# Patient Record
Sex: Female | Born: 1955 | Race: White | Hispanic: No | Smoking: Former smoker
Health system: Southern US, Community
[De-identification: ages and names within clinical notes are randomized; demographics above are authoritative.]

## PROBLEM LIST (undated history)

## (undated) DIAGNOSIS — Z9889 Other specified postprocedural states: Secondary | ICD-10-CM

## (undated) DIAGNOSIS — T7840XA Allergy, unspecified, initial encounter: Secondary | ICD-10-CM

## (undated) DIAGNOSIS — F419 Anxiety disorder, unspecified: Secondary | ICD-10-CM

## (undated) DIAGNOSIS — G250 Essential tremor: Secondary | ICD-10-CM

## (undated) DIAGNOSIS — H269 Unspecified cataract: Secondary | ICD-10-CM

## (undated) DIAGNOSIS — C801 Malignant (primary) neoplasm, unspecified: Secondary | ICD-10-CM

## (undated) HISTORY — DX: Essential tremor: G25.0

## (undated) HISTORY — DX: Malignant (primary) neoplasm, unspecified: C80.1

## (undated) HISTORY — DX: Anxiety disorder, unspecified: F41.9

## (undated) HISTORY — PX: APPENDECTOMY: SHX54

## (undated) HISTORY — DX: Unspecified cataract: H26.9

## (undated) HISTORY — DX: Allergy, unspecified, initial encounter: T78.40XA

## (undated) HISTORY — PX: TUBAL LIGATION: SHX77

## (undated) HISTORY — PX: TONSILLECTOMY: SUR1361

## (undated) HISTORY — PX: BREAST CYST ASPIRATION: SHX578

---

## 1999-07-23 DIAGNOSIS — C801 Malignant (primary) neoplasm, unspecified: Secondary | ICD-10-CM

## 1999-07-23 HISTORY — DX: Malignant (primary) neoplasm, unspecified: C80.1

## 2012-11-18 DIAGNOSIS — G250 Essential tremor: Secondary | ICD-10-CM | POA: Insufficient documentation

## 2013-11-29 ENCOUNTER — Ambulatory Visit: Payer: Self-pay | Admitting: Physician Assistant

## 2013-11-29 IMAGING — US US EXTREM LOW VENOUS*L*
1 series · 13 of 24 positions shown · non-contrast
Comparison: None.

CLINICAL DATA: Left lower extremity pain and swelling evaluate for
DVT



[Series 1: us extrem low venous*left* · 0.09mm/px · 13 of 42 slices shown]
[im 1/42]
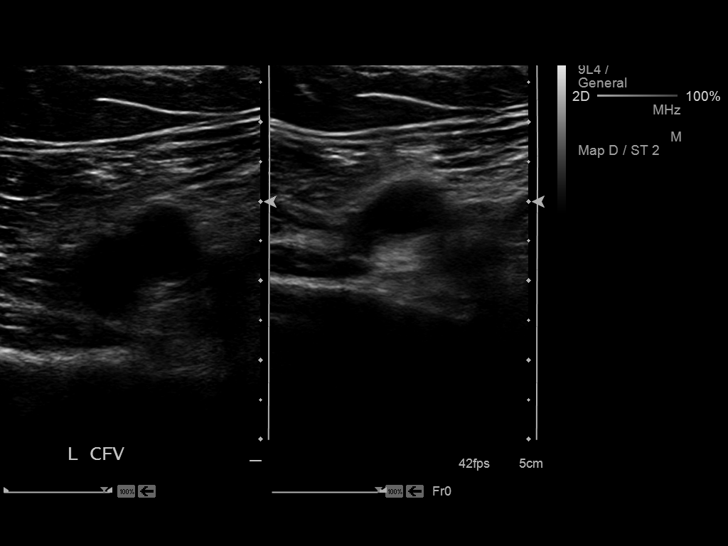
[im 4/42]
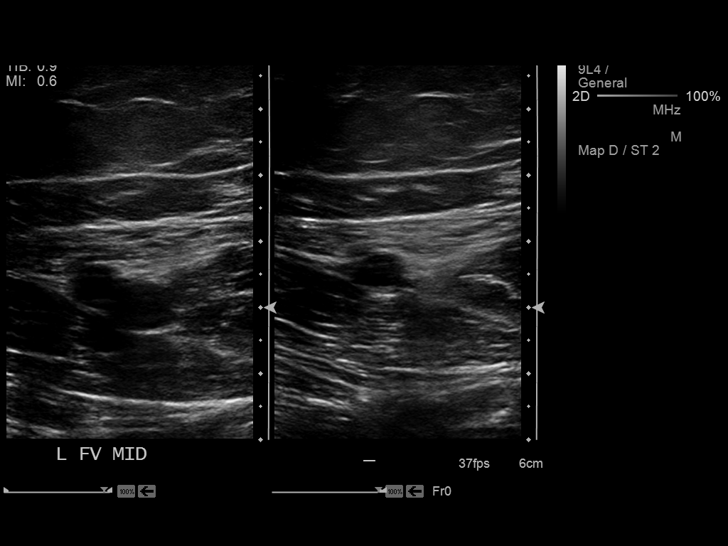
[im 8/42]
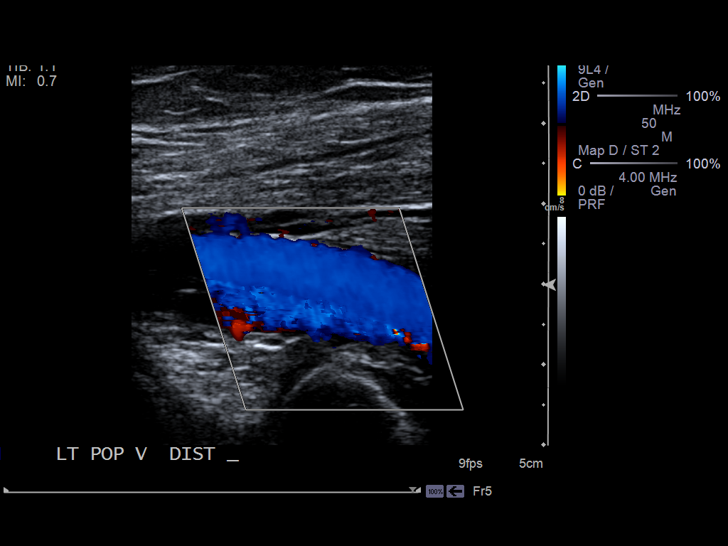
[im 11/42]
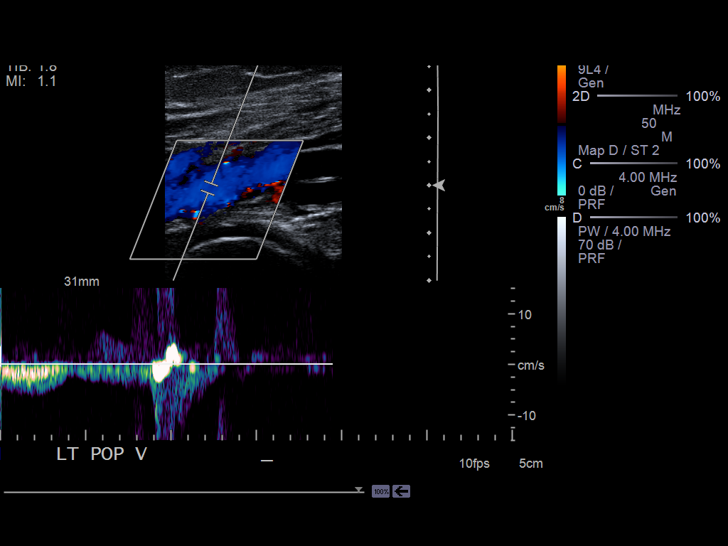
[im 15/42]
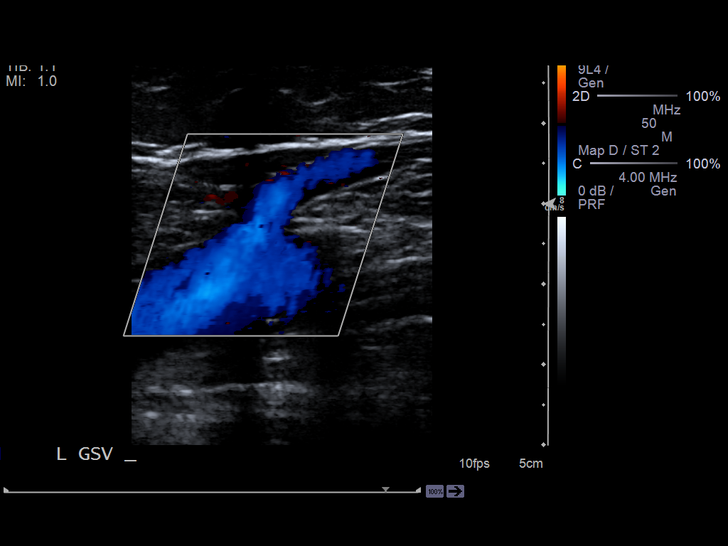
[im 18/42]
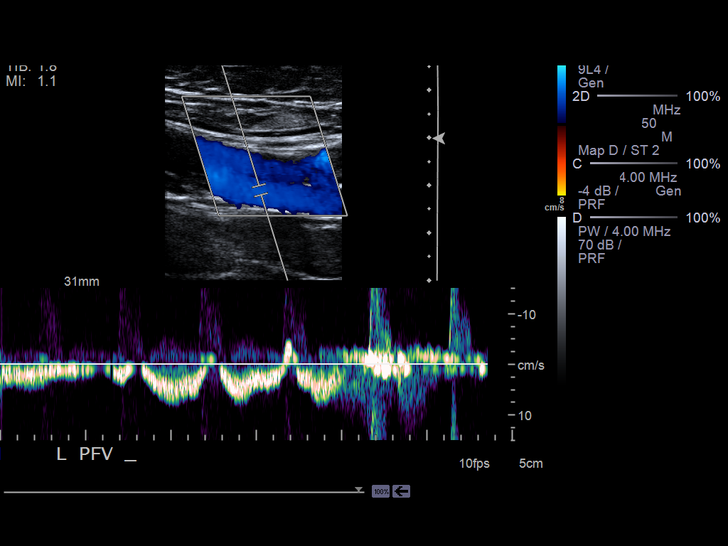
[im 22/42]
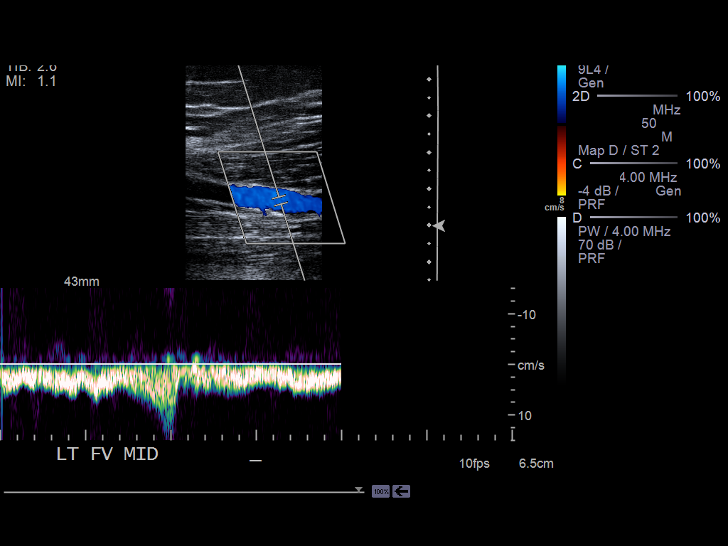
[im 24/42]
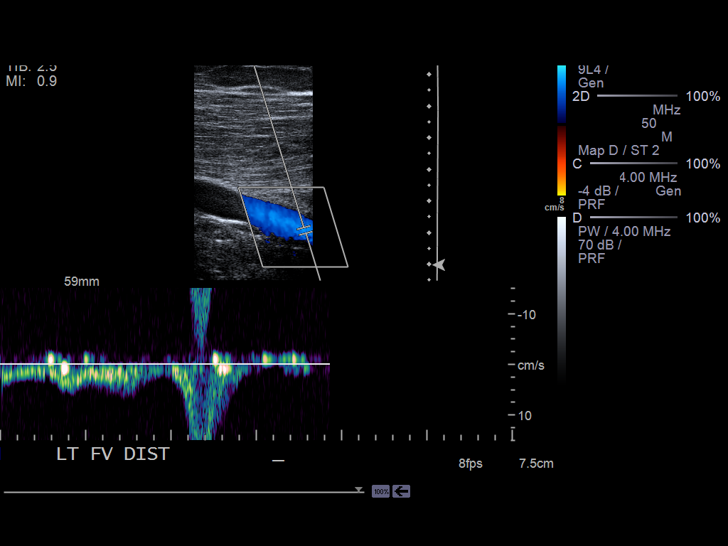
[im 27/42]
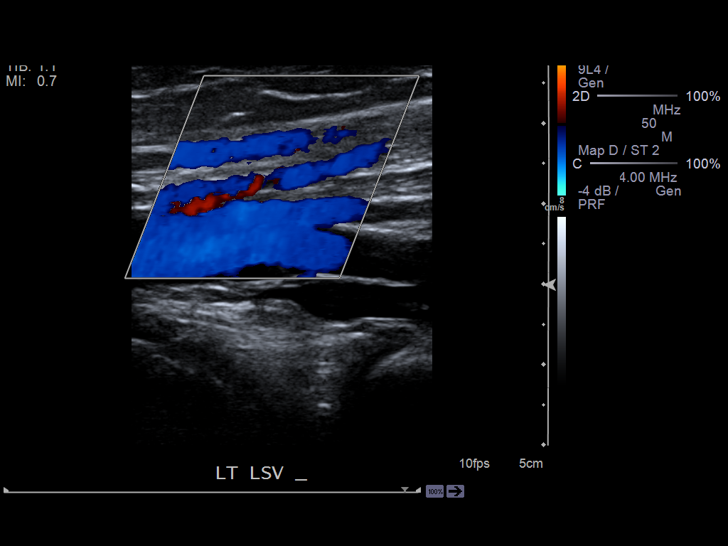
[im 31/42]
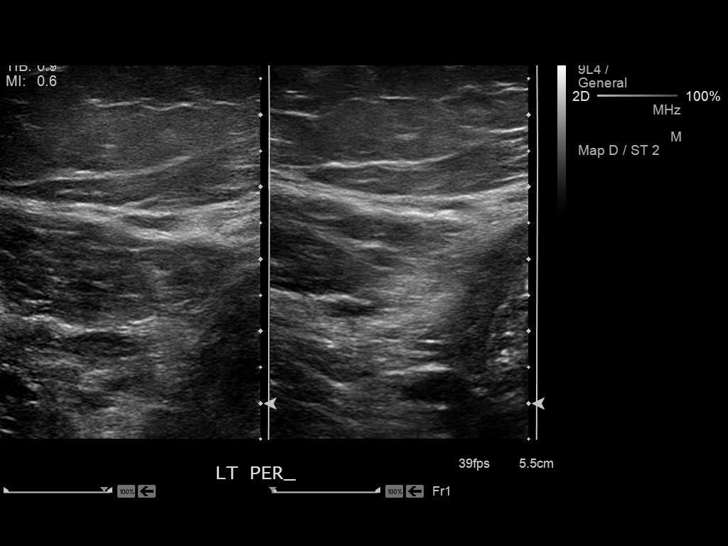
[im 34/42]
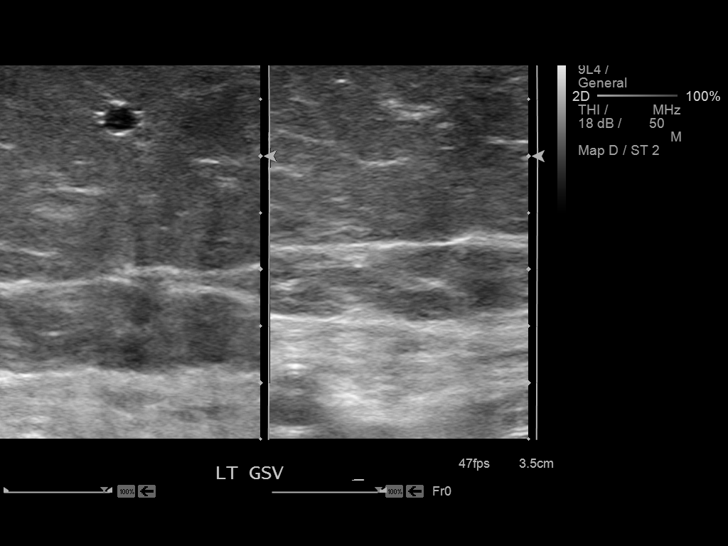
[im 38/42]
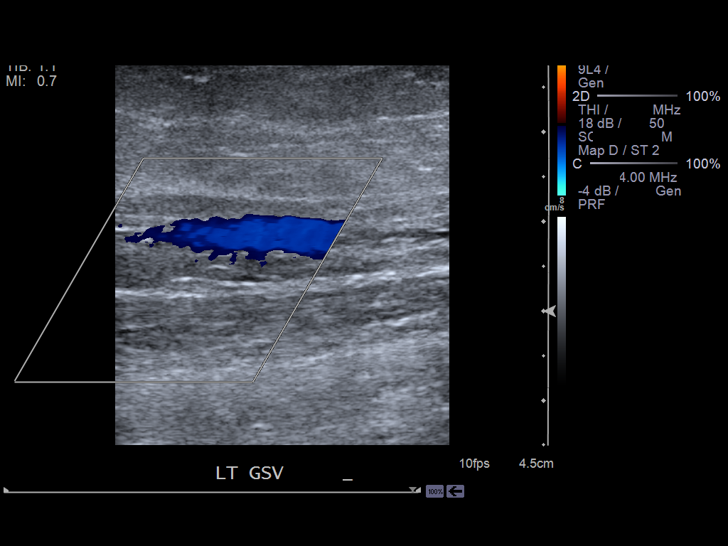
[im 42/42]
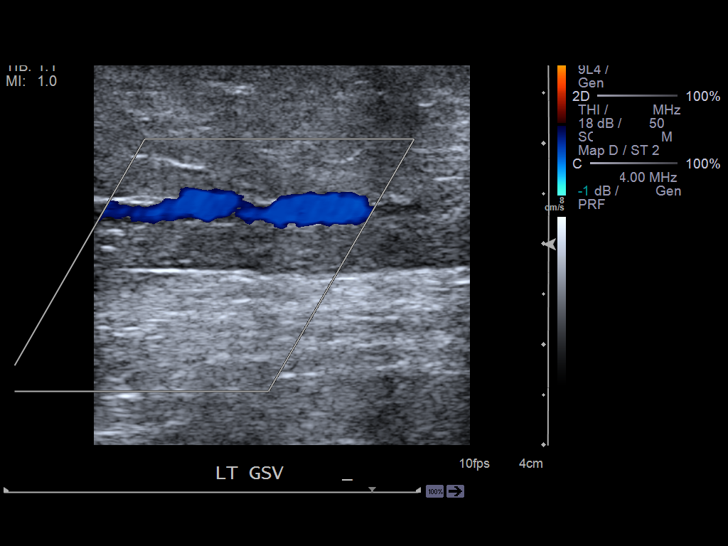

[13 of 24 positions shown; findings below may reference images not displayed]

FINDINGS: Common Femoral Vein: No evidence of thrombus. Normal
compressibility, respiratory phasicity and response to augmentation.

Saphenofemoral Junction: No evidence of thrombus. Normal
compressibility and flow on color Doppler imaging.

Profunda Femoral Vein: No evidence of thrombus. Normal
compressibility and flow on color Doppler imaging.

Femoral Vein: No evidence of thrombus. Normal compressibility,
respiratory phasicity and response to augmentation.

Popliteal Vein: No evidence of thrombus. Normal compressibility,
respiratory phasicity and response to augmentation.

Calf Veins: No evidence of thrombus. Normal compressibility and flow
on color Doppler imaging.

Superficial Great Saphenous Vein: No evidence of thrombus. Normal
compressibility and flow on color Doppler imaging.

Venous Reflux:  None.

Other Findings:  None.
IMPRESSION: No evidence of deep venous thrombosis.

## 2014-04-18 DIAGNOSIS — N6019 Diffuse cystic mastopathy of unspecified breast: Secondary | ICD-10-CM | POA: Insufficient documentation

## 2015-02-03 ENCOUNTER — Encounter: Payer: Self-pay | Admitting: Family Medicine

## 2015-02-03 ENCOUNTER — Ambulatory Visit (INDEPENDENT_AMBULATORY_CARE_PROVIDER_SITE_OTHER): Payer: 59 | Admitting: Family Medicine

## 2015-02-03 ENCOUNTER — Encounter (INDEPENDENT_AMBULATORY_CARE_PROVIDER_SITE_OTHER): Payer: Self-pay

## 2015-02-03 VITALS — BP 124/70 | HR 100 | Temp 98.0°F | Resp 16 | Ht 65.5 in | Wt 167.3 lb

## 2015-02-03 DIAGNOSIS — G250 Essential tremor: Secondary | ICD-10-CM

## 2015-02-03 DIAGNOSIS — F064 Anxiety disorder due to known physiological condition: Secondary | ICD-10-CM | POA: Diagnosis not present

## 2015-02-03 DIAGNOSIS — R208 Other disturbances of skin sensation: Secondary | ICD-10-CM

## 2015-02-03 DIAGNOSIS — R209 Unspecified disturbances of skin sensation: Secondary | ICD-10-CM

## 2015-02-03 DIAGNOSIS — Z77120 Contact with and (suspected) exposure to mold (toxic): Secondary | ICD-10-CM | POA: Insufficient documentation

## 2015-02-03 DIAGNOSIS — Q120 Congenital cataract: Secondary | ICD-10-CM | POA: Insufficient documentation

## 2015-02-03 DIAGNOSIS — Z13220 Encounter for screening for lipoid disorders: Secondary | ICD-10-CM

## 2015-02-03 MED ORDER — ALPRAZOLAM 0.25 MG PO TABS
0.2500 mg | ORAL_TABLET | Freq: Every evening | ORAL | Status: DC | PRN
Start: 2015-02-03 — End: 2021-08-27

## 2015-02-03 NOTE — Progress Notes (Signed)
Name: Jacqueline Hodge   MRN: [id number]    DOB: 06/26/1956   Date:02/03/2015       Progress Note  Subjective  Chief Complaint  Chief Complaint  Patient presents with  . Establish Care    patient question a noctural infestation in home that is affecting her eyes.  . Labs Only    patient has a head tremor and has just stopped the medication for it.    HPI  [name] is a pleasant 59yo female here to establish care today. She has a significant past medical history of resting head tremor. She has seen Neurology specialty in the past and more recently has tried propranolol with minimal improvement in her symptoms. She denies any other related issues such as focal neurological deficits, memory changes, gait or balance disturbance. No suggestion of Parkinson's or other neurological disorders. In relation to her head tremor the patient reports have anxiety and has previously used low dose alprazolam at bed time to help with her symptoms. Recently she has had sensations of bugs crawling on her eyes and in her nostrils at night only. She is not sure if this is an infestation of some sort or mold spores. Her ophthalmologist has not found anything to explain her symptoms. She has been using benadryl at night.  Patient Active Problem List   Diagnosis Date Noted  . Anxiety disorder due to general medical condition 02/03/2015  . 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 Sensation disorder 02/03/2015  . Screening cholesterol level 02/03/2015  . Cystic disease of breast 04/18/2014  . Benign head tremor 11/18/2012    History  Substance Use Topics  . Smoking status: Former Research scientist (life sciences)  . Smokeless tobacco: Not on file     Comment: patient is using the nicotine fum  . Alcohol Use: 0.0 oz/week    0 Standard drinks or equivalent per week     Current outpatient prescriptions:  .  ALPRAZolam (XANAX) 0.25 MG tablet, Take 1 tablet (0.25 mg total) by mouth at bedtime as needed for anxiety., Disp: 30 tablet, Rfl:  3  Past Surgical History  Procedure Laterality Date  . Tonsillectomy    . Appendectomy    . Breast cyst aspiration    . Tubal ligation      Family History  Problem Relation Age of Onset  . Osteoporosis Mother   . Hearing loss Mother   . Hypertension Mother   . Stroke Mother   . Macular degeneration Mother   . Hearing loss Father   . Hyperlipidemia Father   . Heart disease Paternal Grandmother     No Known Allergies   Review of Systems  CONSTITUTIONAL: No significant weight changes, fever, chills, weakness or fatigue.  HEENT:  - Eyes: No visual changes.  - Ears: No auditory changes. No pain.  - Nose: No sneezing, congestion, runny nose. - Throat: No sore throat. No changes in swallowing. SKIN: No rash or itching.  CARDIOVASCULAR: No chest pain, chest pressure or chest discomfort. No palpitations or edema.  RESPIRATORY: No shortness of breath, cough or sputum.  GASTROINTESTINAL: No anorexia, nausea, vomiting. No changes in bowel habits. No abdominal pain or blood.  GENITOURINARY: No dysuria. No frequency. No discharge.  NEUROLOGICAL: No headache, dizziness, syncope, paralysis, ataxia, numbness or tingling in the extremities. No memory changes. No change in bowel or bladder control. Baseline head and neck tremor. MUSCULOSKELETAL: No joint pain. No muscle pain. HEMATOLOGIC: No anemia, bleeding or bruising.  LYMPHATICS: No enlarged lymph nodes.  PSYCHIATRIC:  Yes anxiety. No change in sleep pattern.  ENDOCRINOLOGIC: No reports of sweating, cold or heat intolerance. No polyuria or polydipsia.      Objective  BP 124/70 mmHg  Pulse 100  Temp(Src) 98 F (36.7 C) (Oral)  Resp 16  Ht 5' 5.5" (1.664 m)  Wt 167 lb 4.8 oz (75.887 kg)  BMI 27.41 kg/m2  SpO2 96% Body mass index is 27.41 kg/(m^2).  Physical Exam  Constitutional: Patient appears well-developed and well-nourished. In no distress.  HEENT:  - Head: Normocephalic and atraumatic.  - Ears: Bilateral TMs gray,  no erythema or effusion - Nose: Nasal mucosa moist - Mouth/Throat: Oropharynx is clear and moist. No tonsillar hypertrophy or erythema. No post nasal drainage.  - Eyes: Conjunctivae clear, EOM movements normal. PERRLA. No scleral icterus.  Neck: Normal range of motion. Neck supple. No JVD present. No thyromegaly present.  Cardiovascular: Normal rate, regular rhythm and normal heart sounds.  No murmur heard.  Pulmonary/Chest: Effort normal and breath sounds normal. No respiratory distress. Musculoskeletal: Normal range of motion bilateral UE and LE, no joint effusions. Peripheral vascular: Bilateral LE no edema. Neurological: CN II-XII grossly intact with no focal deficits. Resting head tremor worse with attempt to sit still. Alert and oriented to person, place, and time. Coordination, balance, strength, speech and gait are normal.  Skin: Skin is warm and dry. No rash noted. No erythema.  Psychiatric: Patient has a normal mood and affect. Behavior is normal in office today. Judgment and thought content normal in office today.   Assessment & Plan  1. Benign head tremor Will start with blood work and consider alternative therapies for benign tremor. If symptoms persist or worsen may benefit from further Neurology consultation.  - CBC with Differential/Platelet - Comprehensive metabolic panel - Lipid panel - TSH  2. Anxiety disorder due to general medical condition Anxiety related to head tremor. Trial of alprazolam at bedtime. Discussed potential for habit forming properties. SSRI's have made tremor worse in the past.   - ALPRAZolam (XANAX) 0.25 MG tablet; Take 1 tablet (0.25 mg total) by mouth at bedtime as needed for anxiety.  Dispense: 30 tablet; Refill: 3  3. Congenital cataract Continue follow up with eye specialist.   4. Sensation disorder Description of crawling sensation over eyes and in nostrils unclear as to etiology. May be nerve irritation related vs true infestation. Will  start with blood work.  - CBC with Differential/Platelet - Comprehensive metabolic panel - Lipid panel - TSH  5. Screening cholesterol level  - Lipid panel

## 2015-02-04 LAB — COMPREHENSIVE METABOLIC PANEL
A/G RATIO: 1.9 (ref 1.1–2.5)
ALK PHOS: 66 IU/L (ref 39–117)
ALT: 18 IU/L (ref 0–32)
AST: 21 IU/L (ref 0–40)
Albumin: 4.7 g/dL (ref 3.5–5.5)
BUN / CREAT RATIO: 15 (ref 9–23)
BUN: 14 mg/dL (ref 6–24)
Bilirubin Total: 0.6 mg/dL (ref 0.0–1.2)
CO2: 23 mmol/L (ref 18–29)
Calcium: 10.5 mg/dL — ABNORMAL HIGH (ref 8.7–10.2)
Chloride: 104 mmol/L (ref 97–108)
Creatinine, Ser: 0.92 mg/dL (ref 0.57–1.00)
GFR calc Af Amer: 79 mL/min/{1.73_m2} (ref >59)
GFR calc non Af Amer: 69 mL/min/{1.73_m2} (ref >59)
GLOBULIN, TOTAL: 2.5 g/dL (ref 1.5–4.5)
GLUCOSE: 106 mg/dL — AB (ref 65–99)
Potassium: 5.3 mmol/L — ABNORMAL HIGH (ref 3.5–5.2)
SODIUM: 149 mmol/L — AB (ref 134–144)
Total Protein: 7.2 g/dL (ref 6.0–8.5)

## 2015-02-04 LAB — CBC WITH DIFFERENTIAL/PLATELET
Basophils Absolute: 0.1 10*3/uL (ref 0.0–0.2)
Basos: 1 %
EOS (ABSOLUTE): 0.2 10*3/uL (ref 0.0–0.4)
EOS: 4 %
HEMATOCRIT: 42.3 % (ref 34.0–46.6)
Hemoglobin: 14.5 g/dL (ref 11.1–15.9)
IMMATURE GRANS (ABS): 0 10*3/uL (ref 0.0–0.1)
Immature Granulocytes: 0 %
LYMPHS ABS: 1.6 10*3/uL (ref 0.7–3.1)
Lymphs: 28 %
MCH: 30.5 pg (ref 26.6–33.0)
MCHC: 34.3 g/dL (ref 31.5–35.7)
MCV: 89 fL (ref 79–97)
MONOS ABS: 0.5 10*3/uL (ref 0.1–0.9)
Monocytes: 10 %
Neutrophils Absolute: 3.3 10*3/uL (ref 1.4–7.0)
Neutrophils: 57 %
Platelets: 296 10*3/uL (ref 150–379)
RBC: 4.76 x10E6/uL (ref 3.77–5.28)
RDW: 14.3 % (ref 12.3–15.4)
WBC: 5.7 10*3/uL (ref 3.4–10.8)

## 2015-02-04 LAB — LIPID PANEL
CHOL/HDL RATIO: 2.9 ratio (ref 0.0–4.4)
Cholesterol, Total: 210 mg/dL — ABNORMAL HIGH (ref 100–199)
HDL: 72 mg/dL (ref >39)
LDL Calculated: 111 mg/dL — ABNORMAL HIGH (ref 0–99)
Triglycerides: 133 mg/dL (ref 0–149)
VLDL Cholesterol Cal: 27 mg/dL (ref 5–40)

## 2015-02-04 LAB — TSH: TSH: 1.46 u[IU]/mL (ref 0.450–4.500)

## 2015-02-06 NOTE — Progress Notes (Signed)
No other recommendations at this time. F/u 1 month for repeat lab testing.

## 2015-02-16 ENCOUNTER — Encounter: Payer: Self-pay | Admitting: Family Medicine

## 2015-02-20 ENCOUNTER — Telehealth: Payer: Self-pay | Admitting: Family Medicine

## 2015-02-20 NOTE — Telephone Encounter (Signed)
Patient has an appt on 03/03/15 at 8am

## 2015-02-20 NOTE — Telephone Encounter (Signed)
Contacted this patient to review the message from below. Patient was strongly advised to go to the nearest ER if she develops sx of SOB or tightness in her chest, otherwise she can cont taking the antihistamine as she stated she was, wipe down what she can with Clorox,  and remove herself from that environment as soon as possible. Patient said ok.

## 2015-02-20 NOTE — Telephone Encounter (Signed)
Patient found mold in her home. She will be moving out within 3weeks. Her nasal is stuffy, trouble breathing, spores on her skin. Requesting medication to help until she moves out. Please sent to [email or web address]. [phone]

## 2015-02-20 NOTE — Telephone Encounter (Signed)
Unfortunately there are no medications from a primary medicine standpoint to cure or alleviate household mold exposure. She may try taking an allergy medication found OTC such as allegra, claritin or zyrtec daily but other than that the only other possible avenue is seeing an allergist or pulmonologist if her symptoms or vitals during the next visit suggest she needs to see them. Moving out and way from her home will be the ultimate solution which she is already working on.

## 2015-03-03 ENCOUNTER — Ambulatory Visit (INDEPENDENT_AMBULATORY_CARE_PROVIDER_SITE_OTHER): Payer: 59 | Admitting: Family Medicine

## 2015-03-03 ENCOUNTER — Encounter: Payer: Self-pay | Admitting: Family Medicine

## 2015-03-03 VITALS — BP 118/78 | HR 88 | Temp 97.6°F | Resp 16 | Ht 65.5 in | Wt 169.5 lb

## 2015-03-03 DIAGNOSIS — Z77120 Contact with and (suspected) exposure to mold (toxic): Secondary | ICD-10-CM

## 2015-03-03 DIAGNOSIS — J45909 Unspecified asthma, uncomplicated: Secondary | ICD-10-CM

## 2015-03-03 DIAGNOSIS — G250 Essential tremor: Secondary | ICD-10-CM

## 2015-03-03 DIAGNOSIS — R0982 Postnasal drip: Secondary | ICD-10-CM

## 2015-03-03 DIAGNOSIS — J309 Allergic rhinitis, unspecified: Secondary | ICD-10-CM

## 2015-03-03 MED ORDER — FLUTICASONE PROPIONATE 50 MCG/ACT NA SUSP: 2.0000 | Freq: Every day | NASAL | Status: DC

## 2015-03-03 NOTE — Progress Notes (Signed)
Name: Jacqueline Hodge   MRN: [id number]    DOB: 02/07/1956   Date:03/03/2015       Progress Note  Subjective  Chief Complaint  Chief Complaint  Patient presents with  . Follow-up    patient  is also is here to talk about the mold in her apartment.  . Tremors    patient wants to talk about a new medication.    HPI  Jacqueline Hodge is a pleasant 59yo female with a significant past medical history of resting head tremor. She has seen Neurology specialty in the past and more recently has tried propranolol with minimal improvement in her symptoms. She denies any other related issues such as focal neurological deficits, memory changes, gait or balance disturbance. No suggestion of Parkinson's or other neurological disorders. In relation to her head tremor the patient reports have anxiety and has previously used low dose alprazolam at bed time to help with her symptoms which was restarted at our last visit and she reports that it is helping. In the past she has used propranolol, topiramate, primidone, mirtazapine with minimal relief of head tremor and intolerable side effects. Her grandfather had tremors of his hands.   Recently she has had sensations of bugs crawling on her eyes and in her nostrils at night only. She has found mold in her home and is convinced her symptoms are related to this.  She is moving out in 1 week and hopes to have resolution of the sensations she feels at night. Otherwise she denies shortness of breath, cough, headaches, skin rashes. She is having some sinus congestion however. Her ophthalmologist has not found anything to explain her symptoms. She has been using benadryl at night as needed.   Patient Active Problem List   Diagnosis Date Noted  . Anxiety disorder due to general medical condition 02/03/2015  . 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 Sensation disorder 02/03/2015  . Screening cholesterol level 02/03/2015  . Cystic disease of breast 04/18/2014  . Benign head  tremor 11/18/2012    Social History  Substance Use Topics  . Smoking status: Former Research scientist (life sciences)  . Smokeless tobacco: Not on file     Comment: patient is using the nicotine fum  . Alcohol Use: 0.0 oz/week    0 Standard drinks or equivalent per week     Current outpatient prescriptions:  .  ALPRAZolam (XANAX) 0.25 MG tablet, Take 1 tablet (0.25 mg total) by mouth at bedtime as needed for anxiety., Disp: 30 tablet, Rfl: 3  Past Surgical History  Procedure Laterality Date  . Tonsillectomy    . Appendectomy    . Breast cyst aspiration    . Tubal ligation      Family History  Problem Relation Age of Onset  . Osteoporosis Mother   . Hearing loss Mother   . Hypertension Mother   . Stroke Mother   . Macular degeneration Mother   . Hearing loss Father   . Hyperlipidemia Father   . Heart disease Paternal Grandmother     No Known Allergies   Review of Systems  CONSTITUTIONAL: No significant weight changes, fever, chills, weakness or fatigue.  HEENT:  - Eyes: No visual changes.  - Ears: No auditory changes. No pain.  - Nose: No sneezing, runny nose.  Yes congestion. - Throat: No sore throat. No changes in swallowing. SKIN: No rash or itching.  CARDIOVASCULAR: No chest pain, chest pressure or chest discomfort. No palpitations or edema.  RESPIRATORY: No shortness of breath, cough  or sputum.  GASTROINTESTINAL: No anorexia, nausea, vomiting. No changes in bowel habits. No abdominal pain or blood.  GENITOURINARY: No dysuria. No frequency. No discharge.  NEUROLOGICAL: No headache, dizziness, syncope, paralysis, ataxia, numbness or tingling in the extremities. No memory changes. No change in bowel or bladder control. Baseline head and neck tremor. MUSCULOSKELETAL: No joint pain. No muscle pain. HEMATOLOGIC: No anemia, bleeding or bruising.  LYMPHATICS: No enlarged lymph nodes.  PSYCHIATRIC: Yes anxiety. No change in sleep pattern.  ENDOCRINOLOGIC: No reports of sweating,  cold or heat intolerance. No polyuria or polydipsia.      Objective  BP 118/78 mmHg  Pulse 88  Temp(Src) 97.6 F (36.4 C) (Oral)  Resp 16  Ht 5' 5.5" (1.664 m)  Wt 169 lb 8 oz (76.885 kg)  BMI 27.77 kg/m2  SpO2 97% Body mass index is 27.77 kg/(m^2).  Physical Exam  Constitutional: Patient appears well-developed and well-nourished. In no distress.  HEENT:  - Head: Normocephalic and atraumatic.  - Ears: Bilateral TMs gray, no erythema or effusion - Nose: Nasal mucosa moist, boggy and congested. - Mouth/Throat: Oropharynx is clear and moist. No tonsillar hypertrophy or erythema. Yes post nasal drainage.  - Eyes: Conjunctivae clear, EOM movements normal. PERRLA. No scleral icterus.  Neck: Normal range of motion. Neck supple. No JVD present. No thyromegaly present.  Cardiovascular: Normal rate, regular rhythm and normal heart sounds.  No murmur heard.  Pulmonary/Chest: Effort normal and breath sounds normal. No respiratory distress. Musculoskeletal: Normal range of motion bilateral UE and LE, no joint effusions. Peripheral vascular: Bilateral LE no edema. Neurological: CN II-XII grossly intact with no focal deficits. Resting head tremor worse with attempt to sit still. Alert and oriented to person, place, and time. Coordination, balance, strength, speech and gait are normal.  Skin: Skin is warm and dry. No rash noted. No erythema.  Psychiatric: Patient has a normal mood and affect. Behavior is normal in office today. Judgment and thought content normal in office today.   Recent Results (from the past 2160 hour(s))  CBC with Differential/Platelet     Status: None   Collection Time: 02/03/15 10:23 AM  Result Value Ref Range   WBC 5.7 3.4 - 10.8 x10E3/uL   RBC 4.76 3.77 - 5.28 x10E6/uL   Hemoglobin 14.5 11.1 - 15.9 g/dL   Hematocrit 42.3 34.0 - 46.6 %   MCV 89 79 - 97 fL   MCH 30.5 26.6 - 33.0 pg   MCHC 34.3 31.5 - 35.7 g/dL   RDW 14.3 12.3 - 15.4 %   Platelets 296 150 - 379  x10E3/uL   Neutrophils 57 %   Lymphs 28 %   Monocytes 10 %   Eos 4 %   Basos 1 %   Neutrophils Absolute 3.3 1.4 - 7.0 x10E3/uL   Lymphocytes Absolute 1.6 0.7 - 3.1 x10E3/uL   Monocytes Absolute 0.5 0.1 - 0.9 x10E3/uL   EOS (ABSOLUTE) 0.2 0.0 - 0.4 x10E3/uL   Basophils Absolute 0.1 0.0 - 0.2 x10E3/uL   Immature Granulocytes 0 %   Immature Grans (Abs) 0.0 0.0 - 0.1 x10E3/uL  Comprehensive metabolic panel     Status: Abnormal   Collection Time: 02/03/15 10:23 AM  Result Value Ref Range   Glucose 106 (H) 65 - 99 mg/dL    Comment: Specimen received in contact with cells. No visible hemolysis present. However GLUC may be decreased and K increased. Clinical correlation indicated.    BUN 14 6 - 24 mg/dL   Creatinine, Ser  0.92 0.57 - 1.00 mg/dL   GFR calc non Af Amer 69 >59 mL/min/1.73   GFR calc Af Amer 79 >59 mL/min/1.73   BUN/Creatinine Ratio 15 9 - 23   Sodium 149 (H) 134 - 144 mmol/L   Potassium 5.3 (H) 3.5 - 5.2 mmol/L   Chloride 104 97 - 108 mmol/L   CO2 23 18 - 29 mmol/L   Calcium 10.5 (H) 8.7 - 10.2 mg/dL   Total Protein 7.2 6.0 - 8.5 g/dL   Albumin 4.7 3.5 - 5.5 g/dL   Globulin, Total 2.5 1.5 - 4.5 g/dL   Albumin/Globulin Ratio 1.9 1.1 - 2.5   Bilirubin Total 0.6 0.0 - 1.2 mg/dL   Alkaline Phosphatase 66 39 - 117 IU/L   AST 21 0 - 40 IU/L   ALT 18 0 - 32 IU/L  Lipid panel     Status: Abnormal   Collection Time: 02/03/15 10:23 AM  Result Value Ref Range   Cholesterol, Total 210 (H) 100 - 199 mg/dL   Triglycerides 133 0 - 149 mg/dL   HDL 72 >39 mg/dL    Comment: According to ATP-III Guidelines, HDL-C >59 mg/dL is considered a negative risk factor for CHD.    VLDL Cholesterol Cal 27 5 - 40 mg/dL   LDL Calculated 111 (H) 0 - 99 mg/dL   Chol/HDL Ratio 2.9 0.0 - 4.4 ratio units    Comment:                                   T. Chol/HDL Ratio                                             Men  Women                               1/2 Avg.Risk  3.4    3.3                                    Avg.Risk  5.0    4.4                                2X Avg.Risk  9.6    7.1                                3X Avg.Risk 23.4   11.0   TSH     Status: None   Collection Time: 02/03/15 10:23 AM  Result Value Ref Range   TSH 1.460 0.450 - 4.500 uIU/mL     Assessment & Plan  1. Benign head tremor Offered patient retrial of topiramate, primidone or mirtazapine but she has declined. She will continue to use alprazolam in the evenings.   2. Suspected exposure to mold No clinical consequences identified. Removal from exposure is recommended.  3. Allergic rhinitis with postnasal drip Trial of nasal steroid therapy.  - fluticasone (FLONASE) 50 MCG/ACT nasal spray; Place 2 sprays into both nostrils daily.  Dispense: 16 g; Refill: 2

## 2015-05-26 ENCOUNTER — Encounter: Payer: Self-pay | Admitting: Family Medicine

## 2021-08-27 ENCOUNTER — Encounter: Payer: Self-pay | Admitting: Urology

## 2021-08-27 ENCOUNTER — Ambulatory Visit: Payer: 59 | Admitting: Urology

## 2021-08-27 ENCOUNTER — Other Ambulatory Visit: Payer: Self-pay

## 2021-08-27 VITALS — BP 106/77 | HR 88 | Ht 68.0 in | Wt 200.0 lb

## 2021-08-27 DIAGNOSIS — R300 Dysuria: Secondary | ICD-10-CM | POA: Diagnosis not present

## 2021-08-27 DIAGNOSIS — R350 Frequency of micturition: Secondary | ICD-10-CM

## 2021-08-27 DIAGNOSIS — R3129 Other microscopic hematuria: Secondary | ICD-10-CM | POA: Diagnosis not present

## 2021-08-27 LAB — URINALYSIS, COMPLETE
Bilirubin, UA: NEGATIVE
Glucose, UA: NEGATIVE
Leukocytes,UA: NEGATIVE
Nitrite, UA: NEGATIVE
Specific Gravity, UA: 1.025 (ref 1.005–1.030)
Urobilinogen, Ur: 0.2 mg/dL (ref 0.2–1.0)
pH, UA: 6 (ref 5.0–7.5)

## 2021-08-27 LAB — MICROSCOPIC EXAMINATION

## 2021-08-27 MED ORDER — ESTRADIOL 0.1 MG/GM VA CREA: TOPICAL_CREAM | VAGINAL | 12 refills | Status: AC

## 2021-08-27 NOTE — Progress Notes (Signed)
08/27/2021 11:06 AM   Jacqueline Hodge [birth year] [id number]  Referring provider: Bobetta Lime, [place] [employer] [place],  [place] [address]  Chief Complaint  Patient presents with   Urinary Frequency    HPI: Jacqueline Hodge is a 66 y.o. female who presents for evaluation of recurrent UTIs.  States she had recurrent lower tract UTIs approximately 10 years ago which resolved after starting low-dose vaginal estrogen which she is no longer taking Relates to a 66-month history of recurrent UTIs with symptoms consisting of urinary frequency, urgency and dysuria Record review remarkable for 2 urgent care visits though urinalyses were not performed States she was seen at [facility] on 2/3 with symptoms of frequency, urgency and dysuria.  She was told UA was consistent with infection and a urine culture was ordered.  She was prescribed Macrobid however after taking 1 capsule she complained of severe headache and did not take any further antibiotics.  Her symptoms improved briefly with hydration however returned this morning Long history of microscopic hematuria and was evaluated [facility] in 2018 and had negative cystoscopy and upper tract imaging.   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 If you have any questions, ask your nurse or doctor.          STOP taking these medications    ALPRAZolam 0.25 MG tablet Commonly known as: Duanne Moron Stopped by: Abbie Sons, MD   fluticasone 50 MCG/ACT nasal  spray Commonly known as: FLONASE Stopped by: Abbie Sons, MD       TAKE these medications    Spiriva Respimat 1.25 MCG/ACT Aers Generic drug: Tiotropium Bromide Monohydrate INHALE 2 INHALATIONS (2.5 MCG TOTAL) BY MOUTH INTO THE LUNGS ONCE DAILY        Allergies: No Known Allergies  Family History: Family History  Problem Relation Age of Onset   Osteoporosis Mother    Hearing loss Mother    Hypertension Mother    Stroke Mother    Macular degeneration Mother    Hearing loss Father    Hyperlipidemia Father    Heart disease Paternal Grandmother     Social History:  reports that she has quit smoking. She does not have any smokeless tobacco history on file. She reports current alcohol use. She reports that she does not use drugs.   Physical Exam: BP 106/77    Pulse 88    Ht 5\' 8"  (1.727 m)    Wt 200 lb (90.7 kg)    BMI 30.41 kg/m   Constitutional:  Alert and oriented, No acute distress. HEENT: Advance AT, moist mucus membranes.  Trachea midline, no masses. Cardiovascular: No clubbing, cyanosis, or edema. Respiratory: Normal respiratory effort, no increased work of breathing. Neurologic: Grossly intact, no focal deficits, moving all 4 extremities. Psychiatric: Normal mood and affect.  Laboratory Data:  Urinalysis Appearance-yellow/cloudy  Dipstick-trace ketones/2+ blood/trace protein Microscopy-11-30 RBC/few bacteria   Assessment & Plan:   66 y.o. female with recurrent storage related voiding symptoms and dysuria consistent with UTI though no culture documentation Will contact [facility] and obtain culture result from last week We will await any antibiotic therapy until report reviewed Was given Gemtesa 75 mg samples for storage elated voiding symptoms Urinalysis today with microhematuria.  If urine culture negative would recommend CT urogram and cystoscopy She desires to restart Estrace and Rx sent to [place], [place] [address], [place], [place] [address] [phone]

## 2021-09-27 ENCOUNTER — Other Ambulatory Visit: Payer: 59 | Admitting: Urology

## 2022-01-01 ENCOUNTER — Ambulatory Visit: Payer: 59 | Admitting: Physician Assistant

## 2022-01-01 ENCOUNTER — Encounter: Payer: Self-pay | Admitting: Physician Assistant

## 2022-01-01 VITALS — BP 107/71 | HR 90 | Ht 68.0 in | Wt 200.0 lb

## 2022-01-01 DIAGNOSIS — R3915 Urgency of urination: Secondary | ICD-10-CM

## 2022-01-01 LAB — URINALYSIS, COMPLETE
Bilirubin, UA: NEGATIVE
Glucose, UA: NEGATIVE
Leukocytes,UA: NEGATIVE
Nitrite, UA: NEGATIVE
Specific Gravity, UA: 1.025 (ref 1.005–1.030)
Urobilinogen, Ur: 1 mg/dL (ref 0.2–1.0)
pH, UA: 7 (ref 5.0–7.5)

## 2022-01-01 LAB — MICROSCOPIC EXAMINATION: Epithelial Cells (non renal): >10 /hpf — AB (ref 0–10)

## 2022-01-01 NOTE — Progress Notes (Signed)
01/01/2022 1:35 PM   Jacqueline Hodge June 02, 1956 [id number]  CC: Chief Complaint  Patient presents with   Recurrent UTI   HPI: Jacqueline Hodge is a 66 y.o. female with PMH microscopic hematuria, possible recurrent UTIs who presents today for evaluation of possible UTI.   Today she reports severe diffuse back and lower abdominal pain last night associated with urinary urgency.  This interfered with her sleep.  She took probiotics, oregano oil, and another homeopathic remedy containing coconut oil for management of her symptoms and states while she is tired today due to lack of sleep, she feels improved this morning.  She is still having some bothersome urinary urgency.  She states she has some very mild urgency at baseline but does not wish to pursue pharmacotherapy at this time.  She denies history of nephrolithiasis.  In-office UA today positive for 1+ ketones, trace lysed blood, and trace protein; urine microscopy with >10 epithelial cells/hpf.  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 If you have any questions, ask your nurse or doctor.          estradiol 0.1 MG/GM vaginal cream Commonly known as: ESTRACE Apply a pea-sized amount to vaginal area by fingertip twice weekly   Spiriva Respimat 1.25 MCG/ACT Aers Generic drug: Tiotropium Bromide Monohydrate INHALE 2 INHALATIONS (2.5 MCG TOTAL) BY MOUTH INTO THE LUNGS ONCE DAILY        Allergies:  No Known Allergies  Family History: Family History  Problem  Relation Age of Onset   Osteoporosis Mother    Hearing loss Mother    Hypertension Mother    Stroke Mother    Macular degeneration Mother    Hearing loss Father    Hyperlipidemia Father    Heart disease Paternal Grandmother     Social History:   reports that she has quit smoking. She does not have any smokeless tobacco history on file. She reports current alcohol use. She reports that she does not use drugs.  Physical Exam: BP 107/71   Pulse 90   Ht '5\' 8"'$  (1.727 m)   Wt 200 lb (90.7 kg)   BMI 30.41 kg/m   Constitutional:  Alert and oriented, no acute distress, nontoxic appearing HEENT: [place], AT Cardiovascular: No clubbing, cyanosis, or edema Respiratory: Normal respiratory effort, no increased work of breathing Skin: No rashes, bruises or suspicious lesions Neurologic: Grossly intact, no focal deficits, moving all 4 extremities Psychiatric: Normal mood and affect  Laboratory Data: Results for orders placed or performed in visit on 01/01/22  Microscopic Examination   Urine  Result Value Ref Range   WBC, UA 0-5 0 - 5 /hpf   RBC 0-2 0 - 2 /hpf   Epithelial Cells (non renal) >10 (A) 0 - 10 /hpf   Bacteria, UA Few None seen/Few  Urinalysis, Complete  Result Value Ref Range  Specific Gravity, UA 1.025 1.005 - 1.030   pH, UA 7.0 5.0 - 7.5   Color, UA Yellow Yellow   Appearance Ur Clear Clear   Leukocytes,UA Negative Negative   Protein,UA Trace (A) Negative/Trace   Glucose, UA Negative Negative   Ketones, UA 1+ (A) Negative   RBC, UA Trace (A) Negative   Bilirubin, UA Negative Negative   Urobilinogen, Ur 1.0 0.2 - 1.0 mg/dL   Nitrite, UA Negative Negative   Microscopic Examination See below:    Assessment & Plan:   1. Urinary urgency UA today is benign, though contaminated.  I do not think a UTI would account for her symptoms and her UTI is not consistent with this.  Interestingly, she does not have microscopic hematuria today even though this is a common finding for  her.  I offered her cystoscopy for further evaluation of her symptoms, which I think is appropriate as it appears she has not had 1 in approximately 5 years and she is in agreement with this plan. - Urinalysis, Complete   Return in about 2 weeks (around 01/15/2022) for Cysto with Dr. Bernardo Heater.  Debroah Loop, PA-C  [facility] [address] [place], [place] [address] [phone]

## 2022-01-31 ENCOUNTER — Other Ambulatory Visit: Payer: 59 | Admitting: Urology

## 2022-07-31 ENCOUNTER — Other Ambulatory Visit: Payer: Self-pay | Admitting: Obstetrics and Gynecology

## 2022-07-31 DIAGNOSIS — Z1231 Encounter for screening mammogram for malignant neoplasm of breast: Secondary | ICD-10-CM

## 2022-08-05 NOTE — Progress Notes (Signed)
08/06/2022 7:57 PM   Haynes Dage Seider July 11, 1956 [id number]  Referring provider: Bobetta Lime, [address] [place],  [place] [address]  Urological history: 1. rUTI's -contributing factors of age and vaginal atrophy -documented urine cultures over the last year  07/31/2022 - enterococcus faecalis -vaginal estrogen cream    No chief complaint on file.   HPI: Jacqueline Hodge is a 67 y.o. female who presents today for pressure to urinate and blood in the urine.  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 If you have any questions, ask your nurse or doctor.          estradiol 0.1 MG/GM vaginal cream Commonly known as: ESTRACE Apply a pea-sized amount to vaginal area by fingertip twice weekly   Spiriva Respimat 1.25 MCG/ACT Aers Generic drug: Tiotropium Bromide Monohydrate INHALE 2 INHALATIONS (2.5 MCG TOTAL) BY MOUTH INTO THE LUNGS ONCE DAILY        Allergies: No Known Allergies  Family History: Family History  Problem Relation Age of Onset   Osteoporosis Mother    Hearing loss Mother    Hypertension Mother    Stroke Mother    Macular degeneration Mother    Hearing loss Father    Hyperlipidemia Father    Heart disease Paternal Grandmother     Social History:  reports that she has quit smoking. She does not have any smokeless tobacco history on file. She reports current alcohol use. She reports that she does not use drugs.  ROS: Pertinent ROS in  HPI  Physical Exam: There were no vitals taken for this visit.  Constitutional:  Well nourished. Alert and oriented, No acute distress. HEENT: [place] AT, moist mucus membranes.  Trachea midline, no masses. Cardiovascular: No clubbing, cyanosis, or edema. Respiratory: Normal respiratory effort, no increased work of breathing. GU: No CVA tenderness.  No bladder fullness or masses. Vulvovaginal atrophy w/ pallor, loss of rugae, introital retraction, excoriations.  Vulvar thinning, fusion of labia, clitoral hood retraction, prominent urethral meatus.   *** external genitalia, *** pubic hair distribution, no lesions.  Normal urethral meatus, no lesions, no prolapse, no discharge.   No urethral masses, tenderness and/or tenderness. No bladder fullness, tenderness or masses. *** vagina mucosa, *** estrogen effect, no discharge, no lesions, *** pelvic support, *** cystocele and *** rectocele noted.  No cervical motion tenderness.  Uterus is freely mobile and non-fixed.  No adnexal/parametria masses or tenderness noted.  Anus and perineum are without rashes or lesions.   ***  Neurologic: Grossly intact, no focal deficits, moving all 4 extremities. Psychiatric: Normal mood and affect.    Laboratory Data: WBC (White Blood Cell Count) 4.1 - 10.2 10^3/uL 5.6  RBC (Red Blood Cell Count) 4.04 - 5.48 10^6/uL 4.78  Hemoglobin 12.0 - 15.0 gm/dL 14.2  Hematocrit 35.0 - 47.0 % 43.4  MCV (Mean Corpuscular Volume) 80.0 - 100.0 fl 90.8  MCH (Mean Corpuscular Hemoglobin) 27.0 - 31.2 pg 29.7  MCHC (Mean Corpuscular Hemoglobin Concentration) 32.0 - 36.0 gm/dL 32.7  Platelet Count 150 - 450 10^3/uL 270  RDW-CV (Red Cell Distribution Width) 11.6 - 14.8 % 13.4  MPV (Mean Platelet Volume) 9.4 - 12.4 fl 11.8  Neutrophils 1.50 - 7.80 10^3/uL 3.14  Lymphocytes 1.00 - 3.60 10^3/uL 1.45  Monocytes 0.00 - 1.50 10^3/uL 0.54  Eosinophils 0.00 - 0.55 10^3/uL 0.36  Basophils 0.00 - 0.09 10^3/uL 0.06  Neutrophil % 32.0 - 70.0 % 56.4   Lymphocyte % 10.0 - 50.0 % 26.1  Monocyte % 4.0 - 13.0 % 9.7  Eosinophil % 1.0 - 5.0 % 6.5 High   Basophil% 0.0 - 2.0 % 1.1  Immature Granulocyte % <=0.7 % 0.2  Immature Granulocyte Count <=0.06 10^3/L 0.01  Resulting Agency  [facility] - [facility]   Specimen Collected: 06/12/22 10:18   Performed by: [facility] - [facility] Last Resulted: 06/12/22 11:15  Received From: [facility]  Result Received: 07/31/22 14:14   Glucose 70 - 110 mg/dL 92  Sodium 136 - 145 mmol/L 140  Potassium 3.6 - 5.1 mmol/L 4.6  Chloride 97 - 109 mmol/L 105  Carbon Dioxide (CO2) 22.0 - 32.0 mmol/L 25.5  Urea Nitrogen (BUN) 7 - 25 mg/dL 18  Creatinine 0.6 - 1.1 mg/dL 0.8  Glomerular Filtration Rate (eGFR) >60 mL/min/1.73sq m 82  Comment: CKD-EPI (2021) does not include patient's race in the calculation of eGFR.  Monitoring changes of plasma creatinine and eGFR over time is useful for monitoring kidney function.  Interpretive Ranges for eGFR (CKD-EPI 2021):  eGFR:       >60 mL/min/1.73 sq. m - Normal eGFR:       30-59 mL/min/1.73 sq. m - Moderately Decreased eGFR:       15-29 mL/min/1.73 sq. m  - Severely Decreased eGFR:       < 15 mL/min/1.73 sq. m  - Kidney Failure   Note: These eGFR calculations do not apply in acute situations when eGFR is changing rapidly or patients on dialysis.  Calcium 8.7 - 10.3 mg/dL 9.5  AST 8 - 39 U/L 22  ALT 5 - 38 U/L 22  Alk Phos (alkaline Phosphatase) 34 - 104 U/L 68  Albumin 3.5 - 4.8 g/dL 4.4  Bilirubin, Total 0.3 - 1.2 mg/dL 0.6  Protein, Total 6.1 - 7.9 g/dL 6.9  A/G Ratio 1.0 - 5.0 gm/dL 1.8  Resulting Agency  [facility] - [facility]   Specimen Collected: 06/12/22 10:18   Performed by: [place]: 06/12/22 15:20  Received From: [facility]  Result Received: 07/31/22 14:14   Hemoglobin A1C 4.2 - 5.6 % 5.4  Average Blood Glucose (Calc) mg/dL [facility] -  [facility]  Narrative Performed by [facility] - [facility] Normal Range:    4.2 - 5.6% Increased Risk:  5.7 - 6.4% Diabetes:        >= 6.5% Glycemic Control for adults with diabetes:  <7%    Specimen Collected: 06/12/22 10:18   Performed by: [facility]: 06/12/22 11:36  Received From: [facility]  Result Received: 07/31/22 14:14   Cholesterol, Total 100 - 200 mg/dL 244 High   Triglyceride 35 - 199 mg/dL 159  HDL (High Density  Lipoprotein) Cholesterol 35.0 - 85.0 mg/dL 55.0  LDL Calculated 0 - 130 mg/dL 157 High   VLDL Cholesterol mg/dL 32  Cholesterol/HDL Ratio  4.4  Resulting Agency  [facility] - [facility]   Specimen Collected: 06/12/22 10:18   Performed by: [facility] [facility]: 06/12/22 15:19  Received From: [facility]  Result Received: 07/31/22 14:14   Thyroid Stimulating Hormone (TSH) 0.450-5.330 uIU/ml uIU/mL 2.220  Comment: Reference Range for Pregnant Females >= 18 yrs old: Normal Range for 1st trimester: 0.05-3.70 ulU/ml Normal Range for 2nd trimester: 0.31-4.35 ulU/ml  Resulting [facility] - [facility]   Specimen Collected: 06/12/22 10:18   Performed by: [facility]: 06/12/22 14:48  Received From: [facility]  Result Received: 07/31/22 14:14    Urinalysis ***  I have reviewed the labs.   Pertinent Imaging: ***  Assessment & Plan:  ***  1. Gross hematuria ***  No follow-ups on file.  These notes generated with voice recognition software. I apologize for typographical errors.  [place], [place] [address]  [place], [place] [address] [phone]

## 2022-08-06 ENCOUNTER — Encounter: Payer: Self-pay | Admitting: Urology

## 2022-08-06 ENCOUNTER — Ambulatory Visit: Payer: 59 | Admitting: Urology

## 2022-08-06 VITALS — BP 122/92 | HR 62 | Ht 68.0 in | Wt 200.0 lb

## 2022-08-06 DIAGNOSIS — R110 Nausea: Secondary | ICD-10-CM

## 2022-08-06 DIAGNOSIS — R39198 Other difficulties with micturition: Secondary | ICD-10-CM

## 2022-08-06 DIAGNOSIS — R310 Gross hematuria: Secondary | ICD-10-CM

## 2022-08-06 LAB — MICROSCOPIC EXAMINATION: Epithelial Cells (non renal): >10 /hpf — AB (ref 0–10)

## 2022-08-06 LAB — URINALYSIS, COMPLETE
Bilirubin, UA: NEGATIVE
Glucose, UA: NEGATIVE
Leukocytes,UA: NEGATIVE
Nitrite, UA: NEGATIVE
Protein,UA: NEGATIVE
Specific Gravity, UA: 1.02 (ref 1.005–1.030)
Urobilinogen, Ur: 0.2 mg/dL (ref 0.2–1.0)
pH, UA: 6 (ref 5.0–7.5)

## 2022-08-06 LAB — BLADDER SCAN AMB NON-IMAGING: Scan Result: 0

## 2022-08-06 MED ORDER — NITROFURANTOIN MONOHYD MACRO 100 MG PO CAPS: 100.0000 mg | ORAL_CAPSULE | Freq: Two times a day (BID) | ORAL | 0 refills | Status: AC

## 2022-08-06 MED ORDER — ONDANSETRON HCL 4 MG PO TABS: 4.0000 mg | ORAL_TABLET | Freq: Three times a day (TID) | ORAL | 0 refills | Status: AC | PRN

## 2022-10-04 ENCOUNTER — Ambulatory Visit: Payer: 59 | Admitting: Urology

## 2022-10-24 ENCOUNTER — Other Ambulatory Visit
Admission: RE | Admit: 2022-10-24 | Discharge: 2022-10-24 | Disposition: A | Discharge status: Home / Self Care | Payer: 59 | Source: Ambulatory Visit | Attending: Family Medicine | Admitting: Family Medicine

## 2022-10-24 DIAGNOSIS — M7989 Other specified soft tissue disorders: Secondary | ICD-10-CM | POA: Insufficient documentation

## 2022-10-24 LAB — D-DIMER, QUANTITATIVE: D-Dimer, Quant: 0.3 ug/mL-FEU (ref 0.00–0.50)

## 2022-11-20 ENCOUNTER — Ambulatory Visit
Admission: RE | Admit: 2022-11-20 | Discharge: 2022-11-20 | Disposition: A | Discharge status: Home / Self Care | Payer: 59 | Source: Ambulatory Visit | Attending: Obstetrics and Gynecology | Admitting: Obstetrics and Gynecology

## 2022-11-20 DIAGNOSIS — Z1231 Encounter for screening mammogram for malignant neoplasm of breast: Secondary | ICD-10-CM | POA: Diagnosis present

## 2022-11-20 HISTORY — DX: Other specified postprocedural states: Z98.890

## 2022-11-26 ENCOUNTER — Ambulatory Visit: Payer: 59 | Admitting: Physician Assistant

## 2022-11-26 ENCOUNTER — Encounter: Payer: Self-pay | Admitting: Physician Assistant

## 2022-11-26 VITALS — BP 106/74 | HR 80 | Ht 68.0 in | Wt 200.0 lb

## 2022-11-26 DIAGNOSIS — R3129 Other microscopic hematuria: Secondary | ICD-10-CM

## 2022-11-26 DIAGNOSIS — M545 Low back pain, unspecified: Secondary | ICD-10-CM

## 2022-11-26 LAB — URINALYSIS, COMPLETE
Bilirubin, UA: NEGATIVE
Glucose, UA: NEGATIVE
Ketones, UA: NEGATIVE
Leukocytes,UA: NEGATIVE
Nitrite, UA: NEGATIVE
Protein,UA: NEGATIVE
Specific Gravity, UA: <1.005 — ABNORMAL LOW (ref 1.005–1.030)
Urobilinogen, Ur: 0.2 mg/dL (ref 0.2–1.0)
pH, UA: 6 (ref 5.0–7.5)

## 2022-11-26 LAB — MICROSCOPIC EXAMINATION: Bacteria, UA: NONE SEEN

## 2022-11-26 NOTE — Progress Notes (Signed)
11/26/2022 11:55 AM   Jacqueline Hodge [birth year] [id number]  CC: Chief Complaint  Patient presents with   Recurrent UTI   HPI: Jacqueline Hodge is a 67 y.o. female with PMH occasional UTI and microscopic hematuria with benign workup at [facility] in 2018 who presents today for evaluation of possible UTI.   Today she reports approximately 2 days of low backache and a positive home UTI test.  In-office UA today positive for 2+ blood; urine microscopy with 3-10 RBCs/HPF.  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 If you have any questions, ask your nurse or doctor.          ALPRAZolam 0.25 MG tablet Commonly known as: XANAX Take by mouth.   estradiol 0.1 MG/GM vaginal cream Commonly known as: ESTRACE Apply a pea-sized amount to vaginal area by fingertip twice weekly   multivitamin capsule daily.   nitrofurantoin (macrocrystal-monohydrate) 100 MG capsule Commonly known as: Macrobid Take 1 capsule (100 mg total) by mouth 2 (two) times daily.   ondansetron 4 MG tablet Commonly known as: Zofran Take 1 tablet (4 mg total) by mouth every 8 (eight) hours as needed for nausea or vomiting.   Spiriva Respimat 1.25 MCG/ACT Aers Generic drug: Tiotropium Bromide Monohydrate INHALE 2 INHALATIONS (2.5 MCG TOTAL) BY MOUTH INTO THE LUNGS ONCE DAILY        Allergies:  No Known Allergies  Family History: Family History  Problem Relation Age of Onset   Osteoporosis Mother     Hearing loss Mother    Hypertension Mother    Stroke Mother    Macular degeneration Mother    Hearing loss Father    Hyperlipidemia Father    Heart disease Paternal Grandmother    Breast cancer Cousin     Social History:   reports that she has quit smoking. She does not have any smokeless tobacco history on file. She reports current alcohol use. She reports that she does not use drugs.  Physical Exam: BP 106/74   Pulse 80   Ht 5\' 8"  (1.727 m)   Wt 200 lb (90.7 kg)   BMI 30.41 kg/m   Constitutional:  Alert and oriented, no acute distress, nontoxic appearing HEENT: , AT Cardiovascular: No clubbing, cyanosis, or edema Respiratory: Normal respiratory effort, no increased work of breathing Skin: No rashes, bruises or suspicious lesions Neurologic: Grossly intact, no focal deficits, moving all 4 extremities Psychiatric: Normal mood and affect  Laboratory Data: Results for orders placed or performed in visit on 11/26/22  Microscopic Examination   Urine  Result Value Ref Range   WBC, UA 0-5 0 - 5 /hpf   RBC, Urine 3-10 (A) 0 - 2 /hpf   Epithelial Cells (non renal) 0-10 0 - 10 /hpf   Bacteria, UA None seen  None seen/Few  Urinalysis, Complete  Result Value Ref Range   Specific Gravity, UA <1.005 (L) 1.005 - 1.030   pH, UA 6.0 5.0 - 7.5   Color, UA Yellow Yellow   Appearance Ur Clear Clear   Leukocytes,UA Negative Negative   Protein,UA Negative Negative/Trace   Glucose, UA Negative Negative   Ketones, UA Negative Negative   RBC, UA 2+ (A) Negative   Bilirubin, UA Negative Negative   Urobilinogen, Ur 0.2 0.2 - 1.0 mg/dL   Nitrite, UA Negative Negative   Microscopic Examination See below:    Assessment & Plan:   1. Acute midline low back pain without sciatica Despite positive home UTI testing, her UA today is rather bland with the exception of microscopic hematuria as below.  Low suspicion for UTI, though she requested urine culture regardless. - Urinalysis, Complete -  CULTURE, URINE COMPREHENSIVE  2. Microhematuria In the absence of evidence of UTI as above, we discussed consideration of repeat hematuria workup since her last one was about 6 years ago.  She prefers to defer this given other upcoming medical appointments.  We specifically discussed that we cannot rule out malignant sources of urinary tract bleeding in the absence of repeat workup and she expressed understanding.  Return if symptoms worsen or fail to improve, for Will call with results.  Carman Ching, PA-C  [facility] [place] [address], [address] [place], [place] [address] [phone]

## 2022-11-29 LAB — CULTURE, URINE COMPREHENSIVE
